# Patient Record
Sex: Female | Born: 2005 | Race: Black or African American | Hispanic: No | Marital: Single
Health system: Southern US, Community
[De-identification: ages and names within clinical notes are randomized; demographics above are authoritative.]

---
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
## 2023-05-09 ENCOUNTER — Other Ambulatory Visit: Payer: Self-pay

## 2023-05-09 ENCOUNTER — Emergency Department (HOSPITAL_COMMUNITY): Payer: Medicaid Other

## 2023-05-09 ENCOUNTER — Encounter (HOSPITAL_COMMUNITY): Payer: Self-pay

## 2023-05-09 ENCOUNTER — Emergency Department (HOSPITAL_COMMUNITY)
Admission: EM | Admit: 2023-05-09 | Discharge: 2023-05-10 | Disposition: A | Discharge status: Home / Self Care | Payer: Medicaid Other | Attending: Emergency Medicine | Admitting: Emergency Medicine

## 2023-05-09 DIAGNOSIS — R0789 Other chest pain: Secondary | ICD-10-CM

## 2023-05-09 DIAGNOSIS — D649 Anemia, unspecified: Secondary | ICD-10-CM | POA: Diagnosis not present

## 2023-05-09 DIAGNOSIS — R072 Precordial pain: Secondary | ICD-10-CM | POA: Diagnosis not present

## 2023-05-09 LAB — BASIC METABOLIC PANEL
Anion gap: 10 (ref 5–15)
BUN: 7 mg/dL (ref 6–20)
CO2: 22 mmol/L (ref 22–32)
Calcium: 9.2 mg/dL (ref 8.9–10.3)
Chloride: 104 mmol/L (ref 98–111)
Creatinine, Ser: 0.66 mg/dL (ref 0.44–1.00)
GFR, Estimated: >60 mL/min (ref >60)
Glucose, Bld: 117 mg/dL — ABNORMAL HIGH (ref 70–99)
Potassium: 3.8 mmol/L (ref 3.5–5.1)
Sodium: 136 mmol/L (ref 135–145)

## 2023-05-09 LAB — CBC
HCT: 29.8 % — ABNORMAL LOW (ref 36.0–46.0)
Hemoglobin: 9.2 g/dL — ABNORMAL LOW (ref 12.0–15.0)
MCH: 23.5 pg — ABNORMAL LOW (ref 26.0–34.0)
MCHC: 30.9 g/dL (ref 30.0–36.0)
MCV: 76.2 fL — ABNORMAL LOW (ref 80.0–100.0)
Platelets: 365 10*3/uL (ref 150–400)
RBC: 3.91 MIL/uL (ref 3.87–5.11)
RDW: 15.9 % — ABNORMAL HIGH (ref 11.5–15.5)
WBC: 5.7 10*3/uL (ref 4.0–10.5)
nRBC: 0 % (ref 0.0–0.2)

## 2023-05-09 LAB — HCG, SERUM, QUALITATIVE: Preg, Serum: NEGATIVE

## 2023-05-09 LAB — TROPONIN I (HIGH SENSITIVITY): Troponin I (High Sensitivity): <2 ng/L (ref <18)
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
## 2023-05-09 NOTE — ED Notes (Signed)
Patient transported to X-ray 

## 2023-05-10 LAB — TROPONIN I (HIGH SENSITIVITY): Troponin I (High Sensitivity): 2 ng/L (ref <18)

## 2023-05-10 MED ORDER — NAPROXEN 500 MG PO TABS: 500.0000 mg | ORAL_TABLET | Freq: Two times a day (BID) | ORAL | 0 refills | Status: AC

## 2023-05-10 MED ORDER — CYCLOBENZAPRINE HCL 10 MG PO TABS: 10.0000 mg | ORAL_TABLET | Freq: Two times a day (BID) | ORAL | 0 refills | Status: AC | PRN

## 2023-05-10 NOTE — Discharge Instructions (Signed)
Likely this is chest wall pain, I have given you pain medication please take as prescribed.  May supplement with Tylenol as well, I recommend applying ice to the area.  If Symptoms do not improved after a week's time please follow with your PCP and/or urgent care for further assessment.  Come back to the emergency department if you develop chest pain, shortness of breath, severe abdominal pain, uncontrolled nausea, vomiting, diarrhea.
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
